# Patient Record
Sex: Female | Born: 1935 | Race: White | Hispanic: No | Smoking: Never smoker
Health system: Southern US, Community
[De-identification: ages and names within clinical notes are randomized; demographics above are authoritative.]

## PROBLEM LIST (undated history)

## (undated) DIAGNOSIS — C50919 Malignant neoplasm of unspecified site of unspecified female breast: Secondary | ICD-10-CM

## (undated) HISTORY — DX: Malignant neoplasm of unspecified site of unspecified female breast: C50.919

---

## 2012-10-28 DIAGNOSIS — L570 Actinic keratosis: Secondary | ICD-10-CM | POA: Insufficient documentation

## 2013-05-01 DIAGNOSIS — M5412 Radiculopathy, cervical region: Secondary | ICD-10-CM | POA: Insufficient documentation

## 2015-07-11 DIAGNOSIS — M792 Neuralgia and neuritis, unspecified: Secondary | ICD-10-CM | POA: Insufficient documentation

## 2017-07-24 DIAGNOSIS — G3184 Mild cognitive impairment, so stated: Secondary | ICD-10-CM | POA: Insufficient documentation

## 2017-07-24 DIAGNOSIS — K9041 Non-celiac gluten sensitivity: Secondary | ICD-10-CM | POA: Insufficient documentation

## 2017-07-24 DIAGNOSIS — M159 Polyosteoarthritis, unspecified: Secondary | ICD-10-CM | POA: Insufficient documentation

## 2018-08-13 DIAGNOSIS — M1711 Unilateral primary osteoarthritis, right knee: Secondary | ICD-10-CM | POA: Insufficient documentation

## 2019-03-31 DIAGNOSIS — R7989 Other specified abnormal findings of blood chemistry: Secondary | ICD-10-CM | POA: Insufficient documentation

## 2019-05-25 DIAGNOSIS — D049 Carcinoma in situ of skin, unspecified: Secondary | ICD-10-CM | POA: Insufficient documentation

## 2020-04-17 DIAGNOSIS — D729 Disorder of white blood cells, unspecified: Secondary | ICD-10-CM | POA: Insufficient documentation

## 2020-05-19 DIAGNOSIS — F411 Generalized anxiety disorder: Secondary | ICD-10-CM | POA: Insufficient documentation

## 2020-05-19 DIAGNOSIS — F0150 Vascular dementia without behavioral disturbance: Secondary | ICD-10-CM | POA: Insufficient documentation

## 2020-07-06 DIAGNOSIS — D472 Monoclonal gammopathy: Secondary | ICD-10-CM | POA: Insufficient documentation

## 2020-07-11 DIAGNOSIS — S32010D Wedge compression fracture of first lumbar vertebra, subsequent encounter for fracture with routine healing: Secondary | ICD-10-CM | POA: Insufficient documentation

## 2020-08-18 ENCOUNTER — Emergency Department (HOSPITAL_COMMUNITY)
Admission: EM | Admit: 2020-08-18 | Discharge: 2020-08-18 | Disposition: A | Discharge status: Home / Self Care | Payer: Medicare Other | Attending: Emergency Medicine | Admitting: Emergency Medicine

## 2020-08-18 ENCOUNTER — Emergency Department (HOSPITAL_COMMUNITY): Payer: Medicare Other

## 2020-08-18 ENCOUNTER — Encounter (HOSPITAL_COMMUNITY): Payer: Self-pay | Admitting: Emergency Medicine

## 2020-08-18 DIAGNOSIS — R030 Elevated blood-pressure reading, without diagnosis of hypertension: Secondary | ICD-10-CM

## 2020-08-18 DIAGNOSIS — M25512 Pain in left shoulder: Secondary | ICD-10-CM | POA: Diagnosis present

## 2020-08-18 DIAGNOSIS — K5909 Other constipation: Secondary | ICD-10-CM

## 2020-08-18 DIAGNOSIS — Z91010 Allergy to peanuts: Secondary | ICD-10-CM | POA: Insufficient documentation

## 2020-08-18 DIAGNOSIS — M549 Dorsalgia, unspecified: Secondary | ICD-10-CM | POA: Diagnosis not present

## 2020-08-18 NOTE — ED Notes (Signed)
Patient verbalizes understanding of discharge instructions. Opportunity for questioning and answers were provided. Armband removed by staff, pt discharged from ED via wheelchair with daughter.  

## 2020-08-18 NOTE — Discharge Instructions (Addendum)
It was our pleasure to provide your ER care today - we hope that you feel better.  Your blood pressure is high tonight - follow up with primary care doctor in the coming week for recheck.   For mild constipation, drink plenty of water, get adequate fiber in diet, and consider taking colace 2x/day (stool softener), and miralax once a day as need (laxative).   Return to ER right away if worse, new symptoms, new or severe pain, chest pain, trouble breathing, weak/fainting, fevers, or other concern.

## 2020-08-18 NOTE — ED Triage Notes (Signed)
Pt arrives via [facility] stretcher c/o back pain located near her L scapula. Reports pain started after laying down from dinner. Denies fall/LOC. Per pt has recent hx of thoracic/lumbar fx from fall. Unknown date and severity.   Last EMS VS 180/100, HR 90, 20 RR, 97% on RA, 98.6

## 2020-08-18 NOTE — ED Provider Notes (Signed)
[facility] EMERGENCY DEPARTMENT Provider Note   CSN: [id number] Arrival date & time: 08/18/20  1919     History Chief Complaint  Patient presents with   Back Pain    Madison Bowers is a 86 y.o. female.  Patient c/o pain to left scapular area acute onset at rest tonight. Symptoms acute onset, dull, spasm type pain, moderate, non radiating. No hx same pain. No radicular pain. Denies any anterior pain. No chest pain or discomfort. No sob. No cough or uri symptoms. No abd or pelvic pain, although does states she wondered whether could be constipated as no bm x 2 days. Denies gu c/o, no dysuria or hematuria. No fever or chills. No strain or injury to shoulder or back.   The history is provided by the patient.  Back Pain Associated symptoms: no abdominal pain, no chest pain, no dysuria, no fever and no headaches        History reviewed. No pertinent past medical history.  There are no problems to display for this patient.   History reviewed. No pertinent surgical history.   OB History   No obstetric history on file.     History reviewed. No pertinent family history.  Social History   Tobacco Use   Smoking status: Not on file  Substance Use Topics   Alcohol use: Not on file   Drug use: Not on file    Home Medications Prior to Admission medications   Medication Sig Start Date End Date Taking? Authorizing Provider  memantine (NAMENDA) 10 MG tablet Take 10 mg by mouth 2 (two) times daily.   Yes [provider]  venlafaxine XR (EFFEXOR-XR) 150 MG 24 hr capsule Take 300 mg by mouth daily.    Yes [provider]    Allergies    Sulfa antibiotics, Cyclobenzaprine, and Peanut-containing drug products  Review of Systems   Review of Systems  Constitutional: Negative for fever.  HENT: Negative for sore throat.   Eyes: Negative for redness.  Respiratory: Negative for shortness of breath.   Cardiovascular: Negative for chest  pain and leg swelling.  Gastrointestinal: Negative for abdominal pain, nausea and vomiting.  Genitourinary: Negative for dysuria, flank pain and hematuria.  Musculoskeletal: Positive for back pain. Negative for neck pain.  Skin: Negative for rash.  Neurological: Negative for headaches.  Hematological: Does not bruise/bleed easily.  Psychiatric/Behavioral: Negative for confusion.    Physical Exam Updated Vital Signs BP (!) 201/91 (BP Location: Right Arm)    Pulse 71    Temp 98.7 F (37.1 C) (Oral)    Resp 16    SpO2 99%   Physical Exam Vitals and nursing note reviewed.  Constitutional:      Appearance: Normal appearance. She is well-developed.  HENT:     Head: Atraumatic.     Nose: Nose normal.     Mouth/Throat:     Mouth: Mucous membranes are moist.  Eyes:     General: No scleral icterus.    Conjunctiva/sclera: Conjunctivae normal.  Neck:     Trachea: No tracheal deviation.  Cardiovascular:     Rate and Rhythm: Normal rate and regular rhythm.     Pulses: Normal pulses.     Heart sounds: Normal heart sounds. No murmur heard.  No friction rub. No gallop.   Pulmonary:     Effort: Pulmonary effort is normal. No respiratory distress.     Breath sounds: Normal breath sounds.  Abdominal:     General: Bowel sounds are  normal. There is no distension.     Palpations: Abdomen is soft.     Tenderness: There is no abdominal tenderness. There is no guarding.  Genitourinary:    Comments: No cva tenderness.  Musculoskeletal:        General: No swelling.     Cervical back: Normal range of motion and neck supple. No rigidity. No muscular tenderness.     Comments: T spine non tender, aligned. Mild tenderness medial and inferior to scapula ?muscle spasm. Good rom left shoulder without pain. Bil rad pulses and fem pulses 2+, equal.  Skin:    General: Skin is warm and dry.     Findings: No rash.  Neurological:     Mental Status: She is alert.     Comments: Alert, speech normal.  Motor/sens grossly intact bil. Steady gait.   Psychiatric:        Mood and Affect: Mood normal.     ED Results / Procedures / Treatments   Labs (all labs ordered are listed, but only abnormal results are displayed) Labs Reviewed - No data to display  EKG EKG Interpretation  Date/Time:  Thursday August 18 2020 20:53:24 EDT Ventricular Rate:  71 PR Interval:  132 QRS Duration: 88 QT Interval:  396 QTC Calculation: 430 R Axis:   -29 Text Interpretation: Normal sinus rhythm Non-specific ST-t changes No previous tracing Confirmed by Lajean Saver [phone]) on 08/18/2020 9:18:27 PM   Radiology DG ABD ACUTE 2+V W 1V CHEST  Result Date: 08/18/2020 CLINICAL DATA:  Pain located near her left scapula. EXAM: DG ABDOMEN ACUTE WITH 1 VIEW CHEST COMPARISON:  None. FINDINGS: The heart size and mediastinal contours are within normal limits. Compressive changes at the left base. No focal consolidation. No pulmonary edema. No pleural effusion. No pneumothorax. No acute osseous abnormality. Multilevel degenerative changes of the spine. Left axillary/chest wall surgical changes. Stool throughout the colon. There is no evidence of dilated bowel loops or free intraperitoneal air. No radiopaque calculi or other significant radiographic abnormality is seen. IMPRESSION: No acute cardiopulmonary disease. Negative abdominal radiographs. Electronically Signed   By: Iven Finn M.D.   On: 08/18/2020 20:56    Procedures Procedures (including critical care time)  Medications Ordered in ED Medications - No data to display  ED Course  I have reviewed the triage vital signs and the nursing notes.  Pertinent labs & imaging results that were available during my care of the patient were reviewed by me and considered in my medical decision making (see chart for details).    MDM Rules/Calculators/A&P                          Imaging ordered.   Reviewed nursing notes and prior charts for additional history.    Pt indicates earlier symptoms/pain has completely resolved.   Recheck pt, ambulatory to bathroom. No recurrent of any symptoms. Pt indicates is symptom free and feels ready for d/c to home.   Xrays reviewed/interpreted by me - some stool, no sbo.   Repeat bp 201/91, discussed w pt. No symptoms. No headache. No back pain. No chest pain.   Pt and daughter continue to request d/c to home, indicating no symptoms.   Pt currently appears stable for d/c.   Rec close pcp f/u, including recheck bp.  Return precautions provided.      Final Clinical Impression(s) / ED Diagnoses Final diagnoses:  None    Rx / DC Orders ED Discharge  Orders    None       Lajean Saver, MD 08/18/20 2140

## 2020-09-02 ENCOUNTER — Ambulatory Visit (INDEPENDENT_AMBULATORY_CARE_PROVIDER_SITE_OTHER): Payer: Medicare Other | Admitting: Otolaryngology

## 2020-12-28 ENCOUNTER — Encounter: Payer: Medicare Other | Admitting: Hematology and Oncology

## 2021-09-25 ENCOUNTER — Other Ambulatory Visit: Payer: Self-pay

## 2021-09-25 ENCOUNTER — Ambulatory Visit (INDEPENDENT_AMBULATORY_CARE_PROVIDER_SITE_OTHER): Payer: Medicare Other | Admitting: Physician Assistant

## 2021-09-25 ENCOUNTER — Encounter: Payer: Self-pay | Admitting: Physician Assistant

## 2021-09-25 VITALS — BP 150/86 | HR 82 | Ht 63.0 in | Wt 121.0 lb

## 2021-09-25 DIAGNOSIS — R251 Tremor, unspecified: Secondary | ICD-10-CM | POA: Diagnosis not present

## 2021-09-25 DIAGNOSIS — Z8673 Personal history of transient ischemic attack (TIA), and cerebral infarction without residual deficits: Secondary | ICD-10-CM

## 2021-09-25 DIAGNOSIS — F419 Anxiety disorder, unspecified: Secondary | ICD-10-CM | POA: Insufficient documentation

## 2021-09-25 DIAGNOSIS — K5090 Crohn's disease, unspecified, without complications: Secondary | ICD-10-CM | POA: Insufficient documentation

## 2021-09-25 DIAGNOSIS — R2689 Other abnormalities of gait and mobility: Secondary | ICD-10-CM

## 2021-09-25 NOTE — Progress Notes (Addendum)
Assessment/Plan:   Madison Bowers is delightful 87 y.o. year old [place] female with risk factors including  age, hypertension, hyperlipidemia, chronic cervical radiculopathy, osteoarthritis hypothyroidism,  h/o TIA 05/2020 Crohn's disease, history of IgG monoclonal gammopathy, depression, L breast Ca, and a diagnosis of vascular dementia without behavioral disturbance seen today for evaluation of memory loss and balance issues. MmSE today is 25/30. Patient currently on Memantine 10 mg bid by her PCP    Recommendations:   Mild Dementia with behavioral disturbance Tremor  MRI brain with/without contrast to assess for underlying structural abnormality and assess vascular load  Discussed safety both in and out of the home.  Referral to physical therapy for strength and balance in view of recent falls, as well as changes in her ambulation, prior history of TIA July 2021 Discussed the importance of regular daily schedule to maintain brain function.  Continue to monitor mood with PCP.  Stay active at least 30 minutes at least 3 times a week.  Naps should be scheduled and should be no longer than 60 minutes and should not occur after 2 PM.  Continue Memantine 10 mg twice daily by PCP   Subjective:    The patient is seen in neurologic consultation at the request of Madison Bowers* for the evaluation of memory.  The patient is accompanied by her daughter who supplements the history. This is a 87 y.o. year old [place]  female who has had memory issues for about 12 years, probably worse over the last year.  In July 2021, the patient had acute mental status changes felt to be secondary to a UTI, sustaining a fall, increasing mobility difficulties, requiring physical therapy.  Since that time, her memory is also affected, not remembering events.  Her daughter reports that she has more problems with executive function skills and organizational skills, for example if there is an appointment, she will  not remember, or she also has a hard time recalling when her daughter is to visit.  She continues to repeat stories and asking the same questions.  Emotionally, she also has changes, including angry episodes, making up a story, which she believes is real, and then "snapping out of it and becoming apologetic", something that her daughter describes as "Madison Bowers".  She has moments of sadness.  Her niece came to visit her and brought pictures of her deceased sons, which made her call her daughter telling her that "I am dying ".  Her daughter reports that she does have a history of "massive anxiety and depression ".  She is also very tired, at times does not remember that she was woken up by the staff.  She lives at [facility] and enjoys the facility very much, and takes advantage of all the opportunities there.  She is very social, and enjoys playing bingo, and other games.  She admits to not moving as much as she used to do 1 year ago, when she could walk 2 miles a day.  Now, she requires assistance, and her aide reports that she has fallen a couple of times this month.  Her daughter states that her steps are smaller, and as time goes by when she is standing, she will begin to take "smaller and smaller steps until she loses balance ".  "She cannot stand up on her own ".  She also noticed that she has slightly more tremors than prior, resting, bilateral, left greater than right.  That causes mild difficulty when trying to hold objects,  or writing.  She attends occupational therapy for this at the facility.  She sleeps fairly well, denies vivid dreams or sleepwalking, REM behavior.  She denies hallucinations or paranoia.  There are no hygiene concerns.  "My mother is very vein, and does not like somebody to assist her with dressing and bathing ".  She does require this, and also wears pads for urine incontinence.  She does not like to drink water due to "vanity ", so her "daughter has to monitor her closely.  She  also requires assistance to get dressed "I do not like it ".  The facility provides the medications, her daughter is in charge of the finances.  She does not cook. Appetite is good, no excessive drooling. She denies any headaches, double vision, dizziness, vertigo, focal numbness or tingling, unilateral weakness or anosmia.  No recent COVID.  No history of seizures.  Denies a history of OSA.  She no longer drinks alcohol, or smokes.  Family history remarkable for mother at 62 dying with dementia.  She is retired from [employer], originally from [place].    Note 02/17/2009 DR. Prenkumar  at Neurology [facility] in [place]  "Ms. Madison Bowers is a pleasant 87YO-year old -left-handed woman who presents for evaluation of cognitive concerns. She is accompanied by her friend and hx obtained from both. Patient herself does not think that she has any significant cognitive concerns. Her friend, however, noted some signs in the last year to 18 months. Noted initially repetitive statements. This as since improved with cerafolin and exercise. Misplaces items frequent, but is unclear how increased this is from baseline. She is quite organized and has always keep appointments on calendars,etc. Manages household items. Continues to cook- not forgetting recipes. Is able to use iphone well and computer without difficulty. She does get lost on occasion- but this is again a lifelong trait. No accidents. Has noted some word finding difficulty on occasion. But both, she had friend feel that this is age appropriate. She denies any personality changes. There is no inappropriate behavior. Denies any compulsive hoarding or tendencies. She has always had a "sweet tooth" and dietary habits have not changed. Denies visual hallucinations. Handles all bills and finances without difficulty. Completes all adls. There has been no decline in hygiene- she is always well dressed and well groomed.  Has a hx of mild depression  throughout her life. She denies any concerns at present. She is on Effexor 75mg  daily.  She underwent MRI brain which showed mild ischemic changes. She also underwent formal neuropsychiatric testing, which showed no evidence of mild cognitive impairment."    NEUROPSYCHOLOGY Dr. Glyn Ade Hoogs on 03/13/16:  Cognitive examination shows a mild improvement in global cognitive function compared to the prior examination, and she does not have a primary amnestic disturbance. Given the results of the brain imaging, she likely has a vascular cognitive impairment, subcortical type, and features of the tremor suggest essential tremor.   Brain MRI obtained on 06/04/16 FLAIR sequences showed a moderate amount of periventricular white matter hyperintensities. Cortical atrophy was noted, and gradient echo sequences showed one microhemorrhage in the left parietal-occipital region. Diffusion sequences did not show acute ischemia        Brain  MRI 04/18/2020 after AMS /UTI  . No acute intracranial abnormality.  2. Moderate for age signal changes in the brain compatible with chronic small vessel disease.  3. Partially visible cervical spine degeneration with suspected mild spinal stenosis at C4-C5.   Allergies  Allergen Reactions   Sulfa Antibiotics Hives   Cyclobenzaprine Other (See Comments)    Very dizzy    Current Outpatient Medications  Medication Instructions   busPIRone (BUSPAR) 10 MG tablet Oral   memantine (NAMENDA) 10 mg, Oral, 2 times daily   venlafaxine XR (EFFEXOR-XR) 300 mg, Oral, Daily     VITALS:   Vitals:   09/25/21 1426  BP: (!) 150/86  Pulse: 82  SpO2: 98%  Weight: 121 lb (54.9 kg)  Height: 5\' 3"  (1.6 m)   No flowsheet data found.  PHYSICAL EXAM   HEENT:  Normocephalic, atraumatic. The mucous membranes are moist. The superficial temporal arteries are without ropiness or tenderness. Cardiovascular: Regular rate and rhythm. Lungs: Clear to auscultation bilaterally. Neck:  There are no carotid bruits noted bilaterally.  NEUROLOGICAL: No flowsheet data found. MMSE - Mini Mental State Exam 09/25/2021  Orientation to time 5  Orientation to Place 3  Registration 3  Attention/ Calculation 5  Recall 1  Language- name 2 objects 2  Language- repeat 1  Language- follow 3 step command 3  Language- read & follow direction 1  Write a sentence 1  Copy design 0  Total score 25    No flowsheet data found.   Orientation:  Alert and oriented to person, place and time. No aphasia or dysarthria. Fund of knowledge is appropriate. Recent memory impaired and remote memory intact.  Attention and concentration are normal.  Able to name objects and repeat phrases. Delayed recall  1/3 Cranial nerves: There is good facial symmetry. Extraocular muscles are intact and visual fields are full to confrontational testing. Speech is fluent and clear. Soft palate rises symmetrically and there is no tongue deviation. Hearing is intact to conversational tone. Tone: Tone is good throughout.no cogwheeling noted  Sensation: Sensation is intact to light touch and pinprick throughout. Vibration is intact at the bilateral big toe.There is no extinction with double simultaneous stimulation. There is no sensory dermatomal level identified. Coordination: The patient has no difficulty with RAM's or FNF bilaterally. Normal finger to nose  Motor: Strength is 5/5 in the bilateral upper and lower extremities. There is no pronator drift. There are no fasciculations noted. Tremorsat rest, B, L>R  DTR's: Deep tendon reflexes are 2/4 at the bilateral biceps, triceps, brachioradialis, patella and achilles.  Plantar responses are downgoing bilaterally.  No Hoffmann sign.  No glabellar sign.    Gait and Station: The patient is able to ambulate with  difficulty, small steps unable to get up from the chair on her own, En block turn noted. .The patient is able to heel toe walk without any difficulty. The patient is  unable to stand in the Romberg position on her own.      Thank you for allowing [place] the opportunity to participate in the care of this nice patient. Please do not hesitate to contact us for any questions or concerns.   Total time spent on today's visit was 60 minutes, including both face-to-face time and nonface-to-face time.  Time included that spent on review of records (prior notes available to me/labs/imaging if pertinent), discussing treatment and goals, answering patient's questions and coordinating care.  Cc:  Madison Sessions, NP  Sharene Butters 09/25/2021 3:36 PM

## 2021-09-25 NOTE — Patient Instructions (Addendum)
Very nice to meet you   Continue Memantine 10 mg twice daily. Side effects were discussed  MRI of the brain to further evaluate the structure of the brain and the vascular load  PLease continue Physical Therapy/OT  for strength and BAlance  Follow up in 6 months   Please hydrate  It was a pleasure to see you today at our office.  Continue to care about the cardiovascular risk factors   FALL PRECAUTIONS: Be cautious when walking. Scan the area for obstacles that may increase the risk of trips and falls. When getting up in the mornings, sit up at the edge of the bed for a few minutes before getting out of bed. Consider elevating the bed at the head end to avoid drop of blood pressure when getting up. Walk always in a well-lit room (use night lights in the walls). Avoid area rugs or power cords from appliances in the middle of the walkways. Use a walker or a cane if necessary and consider physical therapy for balance exercise. Get your eyesight checked regularly.      RECOMMENDATIONS FOR ALL PATIENTS WITH MEMORY PROBLEMS: 1. Continue to exercise (Recommend 30 minutes of walking everyday, or 3 hours every week) 2. Increase social interactions - continue going to [place] and enjoy social gatherings with friends and family 3. Eat healthy, avoid fried foods and eat more fruits and vegetables 4. Maintain adequate blood pressure, blood sugar, and blood cholesterol level. Reducing the risk of stroke and cardiovascular disease also helps promoting better memory. 5. Avoid stressful situations. Live a simple life and avoid aggravations. Organize your time and prepare for the next day in anticipation. 6. Sleep well, avoid any interruptions of sleep and avoid any distractions in the bedroom that may interfere with adequate sleep quality 7. Avoid sugar, avoid sweets as there is a strong link between excessive sugar intake, diabetes, and cognitive impairment We discussed the Mediterranean diet, which has been  shown to help patients reduce the risk of progressive memory disorders and reduces cardiovascular risk. This includes eating fish, eat fruits and green leafy vegetables, nuts like almonds and hazelnuts, walnuts, and also use olive oil. Avoid fast foods and fried foods as much as possible. Avoid sweets and sugar as sugar use has been linked to worsening of memory function.

## 2021-09-26 ENCOUNTER — Telehealth: Payer: Self-pay | Admitting: Physician Assistant

## 2021-09-26 NOTE — Telephone Encounter (Signed)
Patient was seen yesterday by wertman, and tested positive yesterday. They had an outbreak at her facility of 20 people

## 2021-10-19 ENCOUNTER — Telehealth: Payer: Self-pay | Admitting: Physician Assistant

## 2021-10-19 ENCOUNTER — Other Ambulatory Visit: Payer: Self-pay | Admitting: Physician Assistant

## 2021-10-19 ENCOUNTER — Ambulatory Visit
Admission: RE | Admit: 2021-10-19 | Discharge: 2021-10-19 | Disposition: A | Discharge status: Home / Self Care | Payer: Medicare Other | Source: Ambulatory Visit | Attending: Physician Assistant | Admitting: Physician Assistant

## 2021-10-19 DIAGNOSIS — R2689 Other abnormalities of gait and mobility: Secondary | ICD-10-CM

## 2021-10-19 DIAGNOSIS — Z8673 Personal history of transient ischemic attack (TIA), and cerebral infarction without residual deficits: Secondary | ICD-10-CM

## 2021-10-19 MED ORDER — DIAZEPAM 5 MG PO TABS: ORAL_TABLET | ORAL | 0 refills | Status: AC

## 2021-10-19 NOTE — Telephone Encounter (Signed)
Pt has an MRI this afternoon and is wondering if she could get a prescription for some medication to help relax her? It would need to go [facility] on [address].

## 2021-10-19 NOTE — Telephone Encounter (Signed)
Pt caregiver called an informed  Clarise Cruz PA Sent prescription to pharmacy for Valium  Take 1 tab  30 mins before the MRI , may take an additional dose prior to the procedure if needed

## 2021-10-30 ENCOUNTER — Other Ambulatory Visit: Payer: Self-pay

## 2021-10-30 ENCOUNTER — Emergency Department (HOSPITAL_COMMUNITY)
Admission: EM | Admit: 2021-10-30 | Discharge: 2021-10-31 | Disposition: A | Discharge status: Home / Self Care | Payer: Medicare Other | Attending: Emergency Medicine | Admitting: Emergency Medicine

## 2021-10-30 DIAGNOSIS — R52 Pain, unspecified: Secondary | ICD-10-CM

## 2021-10-30 DIAGNOSIS — Z853 Personal history of malignant neoplasm of breast: Secondary | ICD-10-CM | POA: Insufficient documentation

## 2021-10-30 DIAGNOSIS — R1084 Generalized abdominal pain: Secondary | ICD-10-CM | POA: Diagnosis present

## 2021-10-30 DIAGNOSIS — F0390 Unspecified dementia without behavioral disturbance: Secondary | ICD-10-CM | POA: Insufficient documentation

## 2021-10-30 DIAGNOSIS — K6289 Other specified diseases of anus and rectum: Secondary | ICD-10-CM | POA: Diagnosis not present

## 2021-10-30 LAB — CBC WITH DIFFERENTIAL/PLATELET
Abs Immature Granulocytes: 0.14 10*3/uL — ABNORMAL HIGH (ref 0.00–0.07)
Basophils Absolute: 0 10*3/uL (ref 0.0–0.1)
Basophils Relative: 0 %
Eosinophils Absolute: 0 10*3/uL (ref 0.0–0.5)
Eosinophils Relative: 0 %
HCT: 35.6 % — ABNORMAL LOW (ref 36.0–46.0)
Hemoglobin: 11.8 g/dL — ABNORMAL LOW (ref 12.0–15.0)
Immature Granulocytes: 1 %
Lymphocytes Relative: 4 %
Lymphs Abs: 0.7 10*3/uL (ref 0.7–4.0)
MCH: 32 pg (ref 26.0–34.0)
MCHC: 33.1 g/dL (ref 30.0–36.0)
MCV: 96.5 fL (ref 80.0–100.0)
Monocytes Absolute: 1.4 10*3/uL — ABNORMAL HIGH (ref 0.1–1.0)
Monocytes Relative: 8 %
Neutro Abs: 14.6 10*3/uL — ABNORMAL HIGH (ref 1.7–7.7)
Neutrophils Relative %: 87 %
Platelets: 187 10*3/uL (ref 150–400)
RBC: 3.69 MIL/uL — ABNORMAL LOW (ref 3.87–5.11)
RDW: 13.1 % (ref 11.5–15.5)
WBC: 16.8 10*3/uL — ABNORMAL HIGH (ref 4.0–10.5)
nRBC: 0 % (ref 0.0–0.2)

## 2021-10-30 LAB — LIPASE, BLOOD: Lipase: 25 U/L (ref 11–51)

## 2021-10-30 LAB — COMPREHENSIVE METABOLIC PANEL
ALT: 12 U/L (ref 0–44)
AST: 16 U/L (ref 15–41)
Albumin: 3.6 g/dL (ref 3.5–5.0)
Alkaline Phosphatase: 51 U/L (ref 38–126)
Anion gap: 10 (ref 5–15)
BUN: 20 mg/dL (ref 8–23)
CO2: 23 mmol/L (ref 22–32)
Calcium: 8.9 mg/dL (ref 8.9–10.3)
Chloride: 101 mmol/L (ref 98–111)
Creatinine, Ser: 0.87 mg/dL (ref 0.44–1.00)
GFR, Estimated: >60 mL/min (ref >60)
Glucose, Bld: 117 mg/dL — ABNORMAL HIGH (ref 70–99)
Potassium: 3.9 mmol/L (ref 3.5–5.1)
Sodium: 134 mmol/L — ABNORMAL LOW (ref 135–145)
Total Bilirubin: 1.4 mg/dL — ABNORMAL HIGH (ref 0.3–1.2)
Total Protein: 6.7 g/dL (ref 6.5–8.1)

## 2021-10-30 NOTE — ED Provider Triage Note (Signed)
Emergency Medicine Provider Triage Evaluation Note Level 5 caveat: Dementia Madison Bowers , a 88 y.o. female  was evaluated in triage.  She is accompanied by her daughter.  Pt complains of generalized abdominal pain onset today. Per patient she has had diarrhea today.  Daughter notes that patient has had generalized weakness, diarrhea, decreased p.o. intake.  Patient has not been given any medications for her symptoms.  Denies any other symptoms.  Review of Systems  Level 5 caveat: Dementia  positive:  Negative:   Physical Exam  BP (!) 142/70    Pulse 79    Temp 99.7 F (37.6 C) (Oral)    Resp 16    SpO2 92%  Gen:   Awake, no distress   Resp:  Normal effort  MSK:   Moves extremities without difficulty  Other:  No abdominal tenderness to palpation.  Medical Decision Making  Medically screening exam initiated at 8:41 PM.  Appropriate orders placed.  Nalayah Hitt was informed that the remainder of the evaluation will be completed by another provider, this initial triage assessment does not replace that evaluation, and the importance of remaining in the ED until their evaluation is complete.   Emely Fahy A, PA-C 10/30/21 2051

## 2021-10-30 NOTE — ED Triage Notes (Signed)
Pt brought here by daughter from [place]. Per staff pt was acting more tired today, wasn't eating her food and had a couple episodes of diarrhea. Staff instructed daughter to take her to UC for fluids, pt "soiled" herself while at facility, and staff determined she was impacted. Daughter brought her here to be disimpacted, pt has no complaints.

## 2021-10-31 ENCOUNTER — Emergency Department (HOSPITAL_COMMUNITY): Payer: Medicare Other

## 2021-10-31 DIAGNOSIS — K6289 Other specified diseases of anus and rectum: Secondary | ICD-10-CM | POA: Diagnosis not present

## 2021-10-31 LAB — URINALYSIS, ROUTINE W REFLEX MICROSCOPIC
Bilirubin Urine: NEGATIVE
Glucose, UA: NEGATIVE mg/dL
Ketones, ur: 15 mg/dL — AB
Nitrite: NEGATIVE
Protein, ur: NEGATIVE mg/dL
Specific Gravity, Urine: 1.01 (ref 1.005–1.030)
pH: 6 (ref 5.0–8.0)

## 2021-10-31 LAB — URINALYSIS, MICROSCOPIC (REFLEX)

## 2021-10-31 MED ORDER — AMOXICILLIN-POT CLAVULANATE 875-125 MG PO TABS: 1.0000 | ORAL_TABLET | Freq: Two times a day (BID) | ORAL | 0 refills | Status: AC

## 2021-10-31 MED ORDER — IOHEXOL 300 MG/ML  SOLN
80.0000 mL | Freq: Once | INTRAMUSCULAR | Status: AC | PRN
  Administered 2021-10-31: 80 mL via INTRAVENOUS

## 2021-10-31 MED ORDER — SODIUM CHLORIDE 0.9 % IV BOLUS
1000.0000 mL | Freq: Once | INTRAVENOUS | Status: AC
  Administered 2021-10-31: 1000 mL via INTRAVENOUS

## 2021-10-31 NOTE — ED Notes (Signed)
Patient back from CT.

## 2021-10-31 NOTE — Discharge Instructions (Signed)
Drink plenty of fluids and follow-up with your family doctor the beginning of the week.  Discuss your constipation with your family doctor and they may consider starting some Metamucil

## 2021-10-31 NOTE — ED Notes (Signed)
Triage RN made aware of temperature, BP, and O2 sat.

## 2021-10-31 NOTE — ED Notes (Signed)
Patient transported to CT 

## 2021-10-31 NOTE — ED Notes (Signed)
Pt cleaned up of small BM, fresh brief placed on pt. Pt assisted in getting dressed. AVS with prescriptions provided to and discussed with patient and family member at bedside. Pt verbalizes understanding of discharge instructions and denies any questions or concerns at this time. Pt has ride home. Pt taken out of department via W/C and assisted into family member's vehicle.

## 2021-10-31 NOTE — ED Notes (Signed)
Patient is having large bowel movements at this time

## 2021-10-31 NOTE — ED Notes (Signed)
Patient assisted to the toilet by this NT and patients daughter. Patient cleaned up and brief put on patient. Barrier ointment put on patient.

## 2021-10-31 NOTE — ED Notes (Signed)
Tap water enema completed. Small amount of liquid stool out. Secure chat sent to MD.

## 2021-10-31 NOTE — ED Provider Notes (Signed)
[facility] EMERGENCY DEPARTMENT Provider Note   CSN: [id number] Arrival date & time: 10/30/21  1800     History  Chief Complaint  Patient presents with   Abdominal Pain    Madison Bowers is a 88 y.o. female.  Patient presents with abdominal pain and weakness and not eating or drinking much.  Patient has history of breast cancer and memory problems.  Patient also has weakness  The history is provided by the patient and medical records. No language interpreter was used.  Abdominal Pain Pain location:  Generalized Pain quality: aching   Pain radiates to:  Does not radiate Pain severity:  Mild Onset quality:  Sudden Timing:  Sporadic Chronicity:  New Context: not alcohol use   Relieved by:  Nothing Worsened by:  Nothing Ineffective treatments:  None tried Associated symptoms: fatigue   Associated symptoms: no chest pain, no cough, no diarrhea and no hematuria       Home Medications Prior to Admission medications   Medication Sig Start Date End Date Taking? Authorizing Provider  amoxicillin-clavulanate (AUGMENTIN) 875-125 MG tablet Take 1 tablet by mouth every 12 (twelve) hours. 10/31/21  Yes Milton Ferguson, MD  busPIRone (BUSPAR) 10 MG tablet Take by mouth. 04/19/20   [provider]  diazepam (VALIUM) 5 MG tablet Take 1 tab  30 mins before the MRI , may take an additional dose prior to the procedure if needed 10/19/21   Rondel Jumbo, PA-C  memantine (NAMENDA) 10 MG tablet Take 10 mg by mouth 2 (two) times daily.    [provider]  venlafaxine XR (EFFEXOR-XR) 150 MG 24 hr capsule Take 300 mg by mouth daily.     [provider]      Allergies    Sulfa antibiotics and Cyclobenzaprine    Review of Systems   Review of Systems  Constitutional:  Positive for fatigue. Negative for appetite change.  HENT:  Negative for congestion, ear discharge and sinus pressure.   Eyes:  Negative for discharge.  Respiratory:  Negative for  cough.   Cardiovascular:  Negative for chest pain.  Gastrointestinal:  Positive for abdominal pain. Negative for diarrhea.  Genitourinary:  Negative for frequency and hematuria.  Musculoskeletal:  Negative for back pain.  Skin:  Negative for rash.  Neurological:  Negative for seizures and headaches.  Psychiatric/Behavioral:  Negative for hallucinations.    Physical Exam Updated Vital Signs BP (!) 139/54    Pulse 74    Temp 99.8 F (37.7 C) (Oral)    Resp (!) 23    SpO2 94%  Physical Exam Vitals and nursing note reviewed.  Constitutional:      Appearance: She is well-developed.  HENT:     Head: Normocephalic.     Nose: Nose normal.  Eyes:     General: No scleral icterus.    Conjunctiva/sclera: Conjunctivae normal.  Neck:     Thyroid: No thyromegaly.  Cardiovascular:     Rate and Rhythm: Normal rate and regular rhythm.     Heart sounds: No murmur heard.   No friction rub. No gallop.  Pulmonary:     Breath sounds: No stridor. No wheezing or rales.  Chest:     Chest wall: No tenderness.  Abdominal:     General: There is no distension.     Tenderness: There is no abdominal tenderness. There is no rebound.  Musculoskeletal:        General: Normal range of motion.     Cervical  back: Neck supple.  Lymphadenopathy:     Cervical: No cervical adenopathy.  Skin:    Findings: No erythema or rash.  Neurological:     Mental Status: She is alert and oriented to person, place, and time.     Motor: No abnormal muscle tone.     Coordination: Coordination normal.  Psychiatric:        Behavior: Behavior normal.    ED Results / Procedures / Treatments   Labs (all labs ordered are listed, but only abnormal results are displayed) Labs Reviewed  CBC WITH DIFFERENTIAL/PLATELET - Abnormal; Notable for the following components:      Result Value   WBC 16.8 (*)    RBC 3.69 (*)    Hemoglobin 11.8 (*)    HCT 35.6 (*)    Neutro Abs 14.6 (*)    Monocytes Absolute 1.4 (*)    Abs Immature  Granulocytes 0.14 (*)    All other components within normal limits  COMPREHENSIVE METABOLIC PANEL - Abnormal; Notable for the following components:   Sodium 134 (*)    Glucose, Bld 117 (*)    Total Bilirubin 1.4 (*)    All other components within normal limits  LIPASE, BLOOD  URINALYSIS, ROUTINE W REFLEX MICROSCOPIC    EKG None  Radiology CT Abdomen Pelvis W Contrast  Result Date: 10/31/2021 CLINICAL DATA:  Sepsis. EXAM: CT ABDOMEN AND PELVIS WITH CONTRAST TECHNIQUE: Multidetector CT imaging of the abdomen and pelvis was performed using the standard protocol following bolus administration of intravenous contrast. RADIATION DOSE REDUCTION: This exam was performed according to the departmental dose-optimization program which includes automated exposure control, adjustment of the mA and/or kV according to patient size and/or use of iterative reconstruction technique. CONTRAST:  32mL OMNIPAQUE IOHEXOL 300 MG/ML  SOLN COMPARISON:  04/17/2020. FINDINGS: Lower chest: Scattered coronary artery calcifications are noted. Mild atelectasis or scarring is present at the lung bases. Hepatobiliary: There is mild fatty infiltration of the liver. The gallbladder is distended and hyperdense material is noted in the dependent portion. No biliary ductal dilatation. Pancreas: Unremarkable. No pancreatic ductal dilatation or surrounding inflammatory changes. Spleen: Normal in size without focal abnormality. Adrenals/Urinary Tract: The adrenal glands are within normal limits. The kidneys enhance symmetrically. There is no hydronephrosis. Evaluation for renal calculus is limited due to excreted contrast in the collecting systems bilaterally. The bladder is unremarkable. Stomach/Bowel: There is a small hiatal hernia. No bowel obstruction, free air or pneumatosis. A moderate to large amount of stool is present in the colon and rectum. There is mild rectal wall thickening with perirectal fat stranding and edema in the  presacral space. The rectum is displaced below the level of the pubococcygeal line and out of the field of view and the possibility of rectal prolapse can not be excluded. Scattered diverticula are noted along the colon without evidence of diverticulitis. The appendix is not visualized on exam. Vascular/Lymphatic: Aortic atherosclerosis. No enlarged abdominal or pelvic lymph nodes. Reproductive: Coarse calcifications are present in the uterus suggesting degenerating fibroids. No adnexal mass is seen. Other: No free fluid. A fat containing inguinal hernia is noted on the left. Musculoskeletal: A left breast prosthesis is noted. Degenerative changes are present in the thoracolumbar spine. Compression deformities are noted at T12 and L1 with progression from 2021 resulting in at least moderate spinal canal stenosis and compression of the thecal sac. No new fracture is identified. IMPRESSION: 1. Rectal wall thickening with perirectal fat stranding and surrounding edema, compatible with proctitis. A  large amount of stool is present in the colon and rectum suggesting constipation. There is inferior displacement of the rectum below the pubococcygeal line and out of the field of view and the possibility of rectal prolapse can not be excluded. 2. Distended gallbladder containing hyperdense material, likely stones. Ultrasound is suggested for further evaluation. 3. Compression deformities at T12 and L1 with progression from the prior exam resulting in at least moderate spinal canal stenosis with likely compression of the thecal sac. 4. Aortic atherosclerosis. 5. Small hiatal hernia. 6. Mild hepatic steatosis. 7. Scattered coronary artery calcifications. Electronically Signed   By: Brett Fairy M.D.   On: 10/31/2021 01:01   US Abdomen Limited RUQ (LIVER/GB)  Result Date: 10/31/2021 CLINICAL DATA:  Abdominal pain EXAM: ULTRASOUND ABDOMEN LIMITED RIGHT UPPER QUADRANT COMPARISON:  CT AP 10/31/2021 FINDINGS: Gallbladder:  Gallbladder sludge and gallstones. Gallstones measure up to 6 mm. No gallbladder wall thickening. No pericholecystic fluid or sonographic Murphy's sign. Common bile duct: Diameter: 8 mm Liver: Increased parenchymal echogenicity consistent with hepatic steatosis. No focal lesion identified. Portal vein is patent on color Doppler imaging with normal direction of blood flow towards the liver. Other: Small amount of perinephric fluid around the right kidney noted. IMPRESSION: 1. Gallstones and gallbladder sludge. No gallbladder wall thickening, pericholecystic fluid or sonographic Murphy's sign noted. 2. Mild increase caliber of the common bile duct measures 8 mm. 3. Increased liver echogenicity compatible with hepatic steatosis. Electronically Signed   By: Kerby Moors M.D.   On: 10/31/2021 09:49    Procedures Procedures    Medications Ordered in ED Medications  iohexol (OMNIPAQUE) 300 MG/ML solution 80 mL (80 mLs Intravenous Contrast Given 10/31/21 0041)  sodium chloride 0.9 % bolus 1,000 mL (0 mLs Intravenous Stopped 10/31/21 1132)    ED Course/ Medical Decision Making/ A&P                           Medical Decision Making Amount and/or Complexity of Data Reviewed Radiology: ordered.  Risk Prescription drug management.  Patient with proctitis and constipation.  I spoke with general surgery and they recommended Augmentin twice a day for along with aggressive bowel treatment.  Patient had a tap water enema and had large stool prior to that.  She will follow-up with her PCP    This patient presents to the ED for concern of weakness and abdominal pain, this involves an extensive number of treatment options, and is a complaint that carries with it a high risk of complications and morbidity.  The differential diagnosis includes cholecystitis, diverticulitis   Co morbidities that complicate the patient evaluation  Breast cancer and mild dementia   Additional history obtained:  Additional  history obtained from daughter External records from outside source obtained and reviewed including hospital record   Lab Tests:  I Ordered, and personally interpreted labs.  The pertinent results include: White blood count elevated 16,000   Imaging Studies ordered:  I ordered imaging studies including CT abdomen and ultrasound of the gallbladder I independently visualized and interpreted imaging which showed gallstones and proctitis I agree with the radiologist interpretation   Cardiac Monitoring:  The patient was maintained on a cardiac monitor.  I personally viewed and interpreted the cardiac monitored which showed an underlying rhythm of: Normal sinus rhythm   Medicines ordered and prescription drug management:  I ordered medication including normal saline bolus Reevaluation of the patient after these medicines showed that the patient improved  I have reviewed the patients home medicines and have made adjustments as needed   Test Considered:  MRI of the abdomen   Critical Interventions:  Normal saline bolus   Consultations Obtained:  I requested consultation with the general surgeon,  and discussed lab and imaging findings as well as pertinent plan - they recommend: Discharge with Augmentin   Problem List / ED Course:  Proctitis, gallstone, breast cancer   Reevaluation:  After the interventions noted above, I reevaluated the patient and found that they have :improved   Social Determinants of Health:  Mild dementia   Dispostion:  After consideration of the diagnostic results and the patients response to treatment, I feel that the patent would benefit from discharge home placed on Augmentin and will follow up with PCP.         Final Clinical Impression(s) / ED Diagnoses Final diagnoses:  Pain  Proctitis    Rx / DC Orders ED Discharge Orders          Ordered    amoxicillin-clavulanate (AUGMENTIN) 875-125 MG tablet  Every 12 hours         10/31/21 1156              Milton Ferguson, MD 11/02/21 1007

## 2021-10-31 NOTE — ED Notes (Signed)
PA in traige  made aware of trending temp. Orders to follow. Chrage RN also  made aware

## 2021-10-31 NOTE — ED Notes (Signed)
Triage rn sarah notified of temp

## 2022-03-26 ENCOUNTER — Ambulatory Visit: Payer: Medicare Other | Admitting: Physician Assistant

## 2022-07-23 IMAGING — CT CT ABD-PELV W/ CM
2 of 5 series · 15 of 46 positions shown, 17 images · IV contrast (Omni 300)
Comparison: 04/17/2020.

CLINICAL DATA: Sepsis.

EXAM:
CT ABDOMEN AND PELVIS WITH CONTRAST
TECHNIQUE: Multidetector CT imaging of the abdomen and pelvis was performed
using the standard protocol following bolus administration of
intravenous contrast.

[Series 3: a/p w/ 5mm · axial · 0.80mm/px · z∈[-109,+261]mm · 12 of 84 slices shown, 14 images]
[im 5/84  soft-tissue]
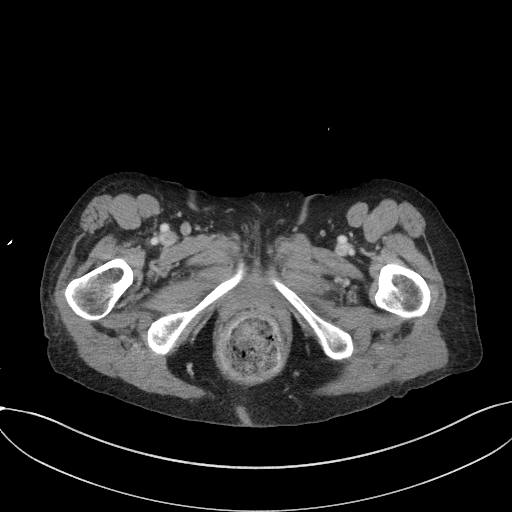
[im 5/84  bone]
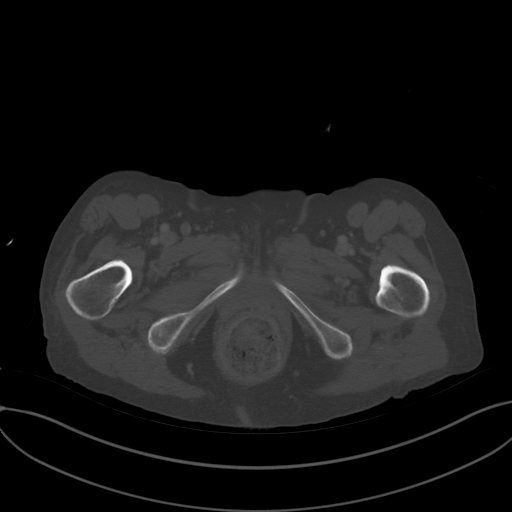
[im 14/84  soft-tissue]
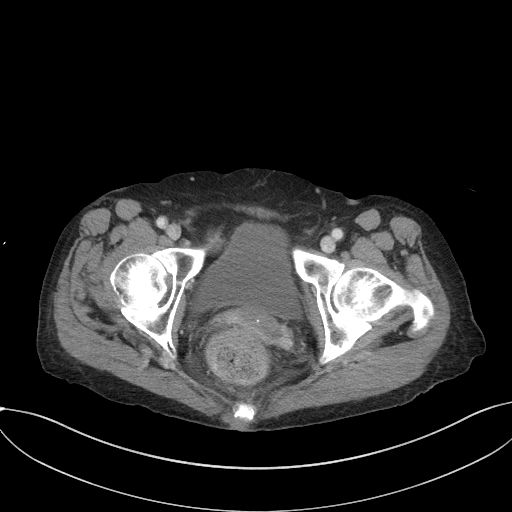
[im 19/84  soft-tissue]
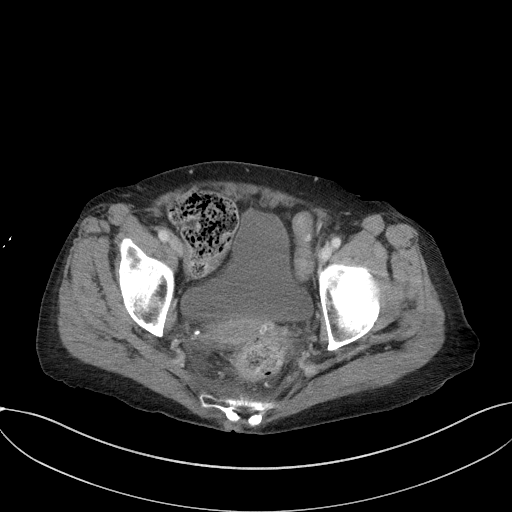
[im 24/84  soft-tissue]
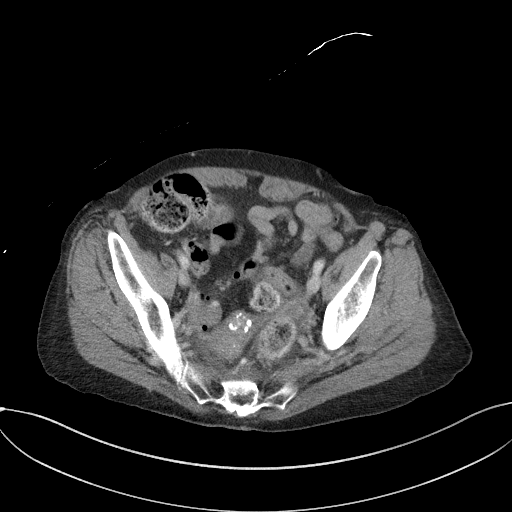
[im 33/84  soft-tissue]
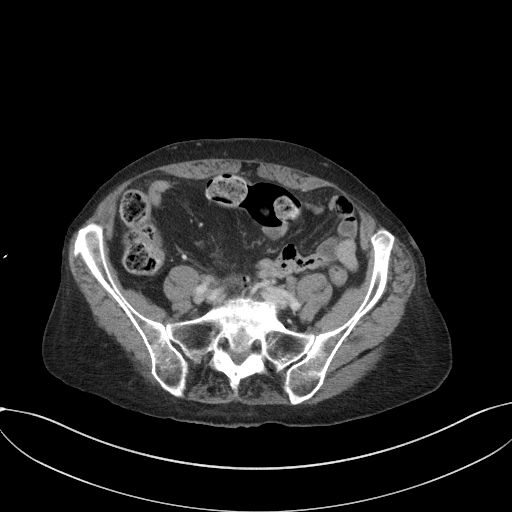
[im 37/84  soft-tissue]
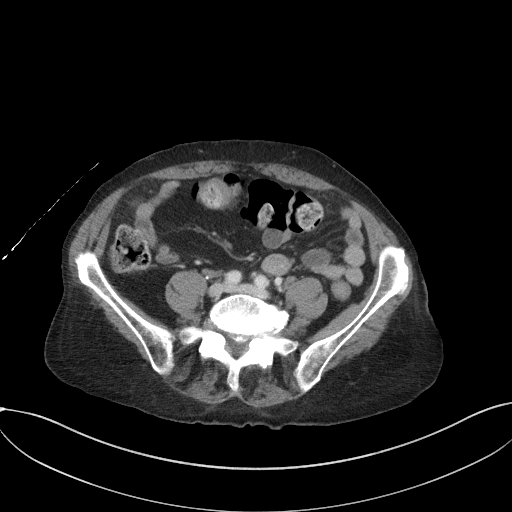
[im 47/84  soft-tissue]
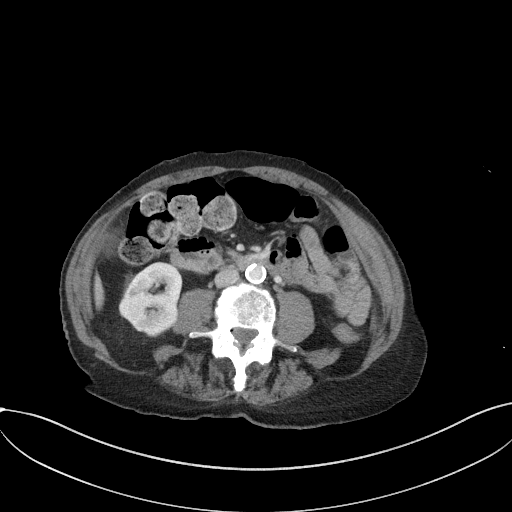
[im 51/84  soft-tissue]
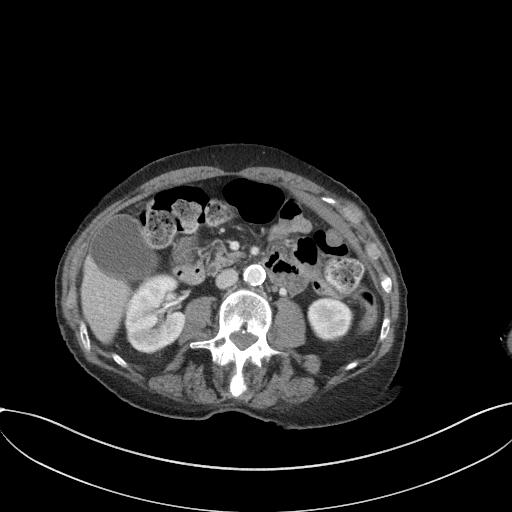
[im 60/84  soft-tissue]
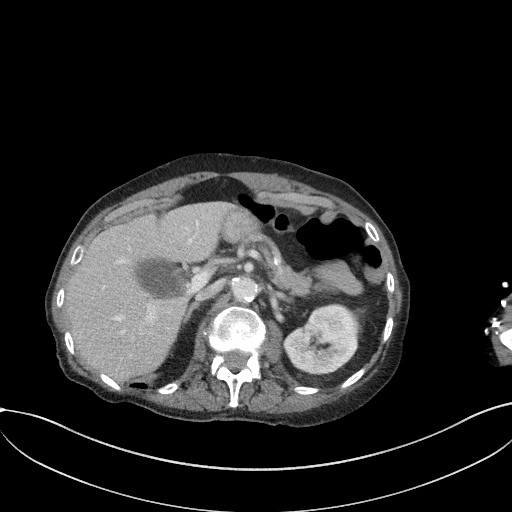
[im 60/84  bone]
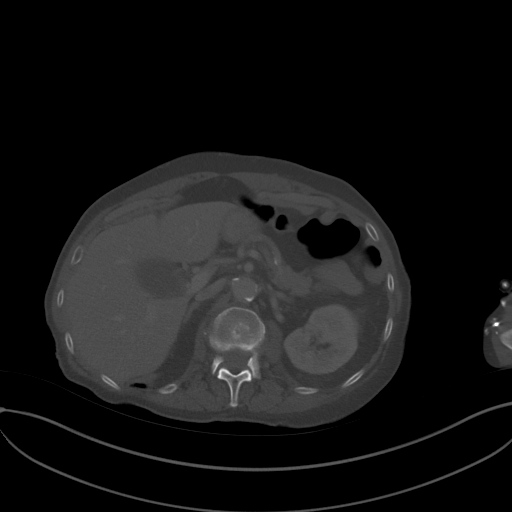
[im 65/84  soft-tissue]
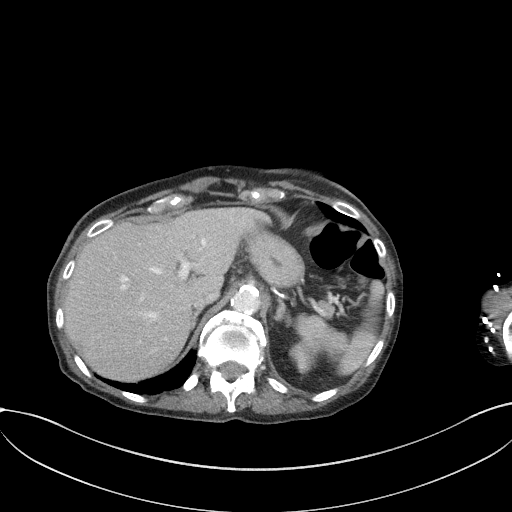
[im 70/84  soft-tissue]
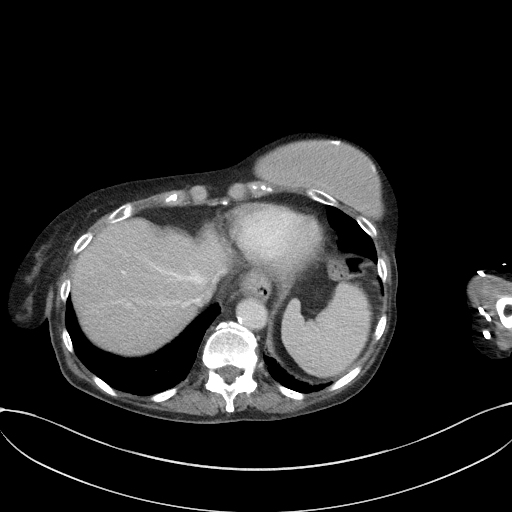
[im 79/84  soft-tissue]
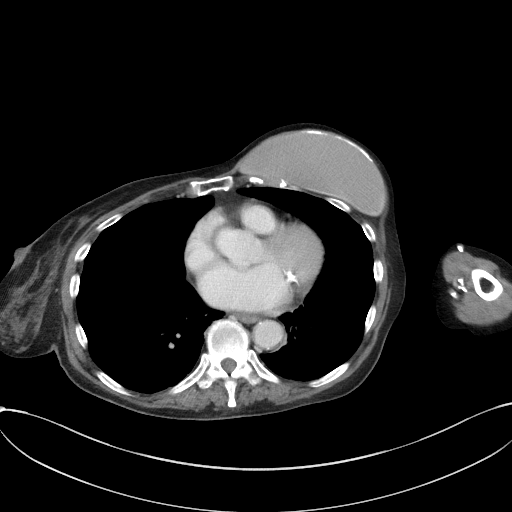

[Series 7: a/p w/ cor · coronal · 0.81mm/px · 3 of 128 slices shown]
[im 43/128  soft-tissue]
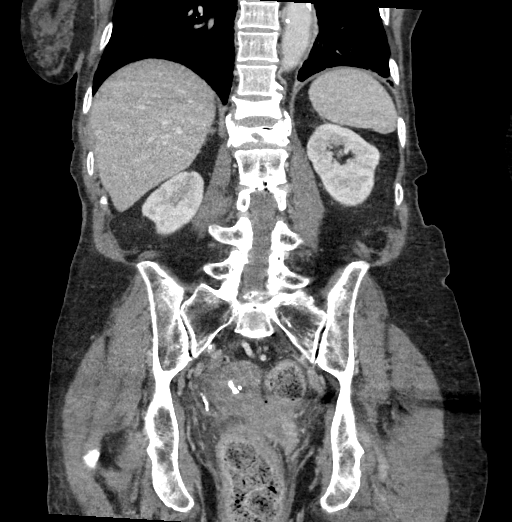
[im 57/128  soft-tissue]
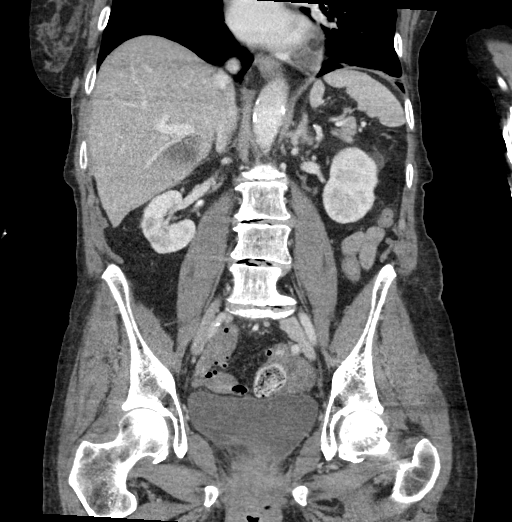
[im 71/128  soft-tissue]
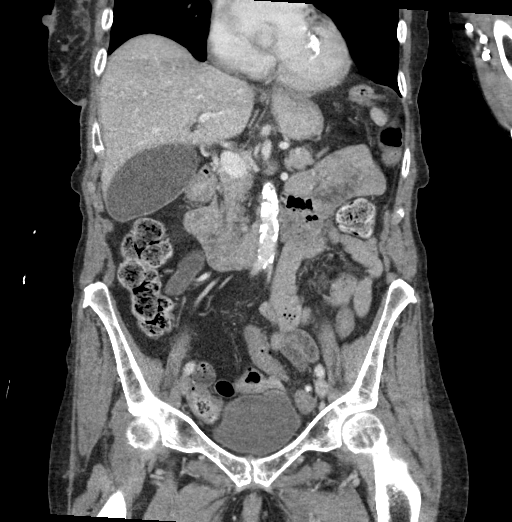

[15 of 46 positions shown; findings below may reference images not displayed]

RADIATION DOSE REDUCTION: This exam was performed according to the
departmental dose-optimization program which includes automated
exposure control, adjustment of the mA and/or kV according to
patient size and/or use of iterative reconstruction technique.

CONTRAST:  80mL OMNIPAQUE IOHEXOL 300 MG/ML  SOLN
FINDINGS: Lower chest: Scattered coronary artery calcifications are noted.
Mild atelectasis or scarring is present at the lung bases.

Hepatobiliary: There is mild fatty infiltration of the liver. The
gallbladder is distended and hyperdense material is noted in the
dependent portion. No biliary ductal dilatation.

Pancreas: Unremarkable. No pancreatic ductal dilatation or
surrounding inflammatory changes.

Spleen: Normal in size without focal abnormality.

Adrenals/Urinary Tract: The adrenal glands are within normal limits.
The kidneys enhance symmetrically. There is no hydronephrosis.
Evaluation for renal calculus is limited due to excreted contrast in
the collecting systems bilaterally. The bladder is unremarkable.

Stomach/Bowel: There is a small hiatal hernia. No bowel obstruction,
free air or pneumatosis. A moderate to large amount of stool is
present in the colon and rectum. There is mild rectal wall
thickening with perirectal fat stranding and edema in the presacral
space. The rectum is displaced below the level of the pubococcygeal
line and out of the field of view and the possibility of rectal
prolapse can not be excluded. Scattered diverticula are noted along
the colon without evidence of diverticulitis. The appendix is not
visualized on exam.

Vascular/Lymphatic: Aortic atherosclerosis. No enlarged abdominal or
pelvic lymph nodes.

Reproductive: Coarse calcifications are present in the uterus
suggesting degenerating fibroids. No adnexal mass is seen.

Other: No free fluid. A fat containing inguinal hernia is noted on
the left.

Musculoskeletal: A left breast prosthesis is noted. Degenerative
changes are present in the thoracolumbar spine. Compression
deformities are noted at T12 and L1 with progression from 5851
resulting in at least moderate spinal canal stenosis and compression
of the thecal sac. No new fracture is identified.
IMPRESSION: 1. Rectal wall thickening with perirectal fat stranding and
surrounding edema, compatible with proctitis. A large amount of
stool is present in the colon and rectum suggesting constipation.
There is inferior displacement of the rectum below the pubococcygeal
line and out of the field of view and the possibility of rectal
prolapse can not be excluded.
2. Distended gallbladder containing hyperdense material, likely
stones. Ultrasound is suggested for further evaluation.
3. Compression deformities at T12 and L1 with progression from the
prior exam resulting in at least moderate spinal canal stenosis with
likely compression of the thecal sac.
4. Aortic atherosclerosis.
5. Small hiatal hernia.
6. Mild hepatic steatosis.
7. Scattered coronary artery calcifications.

## 2022-08-26 ENCOUNTER — Emergency Department (HOSPITAL_COMMUNITY): Payer: Medicare Other

## 2022-08-26 ENCOUNTER — Emergency Department (HOSPITAL_COMMUNITY)
Admission: EM | Admit: 2022-08-26 | Discharge: 2022-08-26 | Discharge status: Against Medical Advice | Payer: Medicare Other | Attending: Physician Assistant | Admitting: Physician Assistant

## 2022-08-26 ENCOUNTER — Encounter (HOSPITAL_COMMUNITY): Payer: Self-pay

## 2022-08-26 ENCOUNTER — Other Ambulatory Visit: Payer: Self-pay

## 2022-08-26 DIAGNOSIS — R410 Disorientation, unspecified: Secondary | ICD-10-CM | POA: Insufficient documentation

## 2022-08-26 DIAGNOSIS — Z5321 Procedure and treatment not carried out due to patient leaving prior to being seen by health care provider: Secondary | ICD-10-CM | POA: Insufficient documentation

## 2022-08-26 DIAGNOSIS — W19XXXA Unspecified fall, initial encounter: Secondary | ICD-10-CM | POA: Diagnosis not present

## 2022-08-26 DIAGNOSIS — N390 Urinary tract infection, site not specified: Secondary | ICD-10-CM | POA: Insufficient documentation

## 2022-08-26 LAB — COMPREHENSIVE METABOLIC PANEL
ALT: 10 U/L (ref 0–44)
AST: 20 U/L (ref 15–41)
Albumin: 3.4 g/dL — ABNORMAL LOW (ref 3.5–5.0)
Alkaline Phosphatase: 52 U/L (ref 38–126)
Anion gap: 8 (ref 5–15)
BUN: 22 mg/dL (ref 8–23)
CO2: 27 mmol/L (ref 22–32)
Calcium: 9.5 mg/dL (ref 8.9–10.3)
Chloride: 104 mmol/L (ref 98–111)
Creatinine, Ser: 0.93 mg/dL (ref 0.44–1.00)
GFR, Estimated: 58 mL/min — ABNORMAL LOW (ref >60)
Glucose, Bld: 155 mg/dL — ABNORMAL HIGH (ref 70–99)
Potassium: 4.2 mmol/L (ref 3.5–5.1)
Sodium: 139 mmol/L (ref 135–145)
Total Bilirubin: 0.3 mg/dL (ref 0.3–1.2)
Total Protein: 6.6 g/dL (ref 6.5–8.1)

## 2022-08-26 LAB — CBC WITH DIFFERENTIAL/PLATELET
Abs Immature Granulocytes: 0.03 10*3/uL (ref 0.00–0.07)
Basophils Absolute: 0.1 10*3/uL (ref 0.0–0.1)
Basophils Relative: 1 %
Eosinophils Absolute: 0.2 10*3/uL (ref 0.0–0.5)
Eosinophils Relative: 3 %
HCT: 36.9 % (ref 36.0–46.0)
Hemoglobin: 11.6 g/dL — ABNORMAL LOW (ref 12.0–15.0)
Immature Granulocytes: 1 %
Lymphocytes Relative: 14 %
Lymphs Abs: 0.9 10*3/uL (ref 0.7–4.0)
MCH: 31.1 pg (ref 26.0–34.0)
MCHC: 31.4 g/dL (ref 30.0–36.0)
MCV: 98.9 fL (ref 80.0–100.0)
Monocytes Absolute: 0.7 10*3/uL (ref 0.1–1.0)
Monocytes Relative: 11 %
Neutro Abs: 4.6 10*3/uL (ref 1.7–7.7)
Neutrophils Relative %: 70 %
Platelets: 213 10*3/uL (ref 150–400)
RBC: 3.73 MIL/uL — ABNORMAL LOW (ref 3.87–5.11)
RDW: 12.6 % (ref 11.5–15.5)
WBC: 6.6 10*3/uL (ref 4.0–10.5)
nRBC: 0 % (ref 0.0–0.2)

## 2022-08-26 LAB — LIPASE, BLOOD: Lipase: 36 U/L (ref 11–51)

## 2022-08-26 NOTE — ED Triage Notes (Signed)
Pt arrives to ED POV with caregiver. Per Caregiver upon morning rounds pt was found sitting on the floor of her room. Fall was not witnessed. Pt A/O x3. Caregiver states pt has been more confused. No blood thinners, No trauma noted.

## 2022-08-26 NOTE — ED Notes (Signed)
Patient left on own accord °

## 2022-08-26 NOTE — ED Provider Triage Note (Signed)
Emergency Medicine Provider Triage Evaluation Note  Madison Bowers , a 88 y.o. female  was evaluated in triage.  Pt complains of confusion.  Was seen by urgent care diagnosed with UTI.  Has had multiple falls over the last 24 hours.  Patient's caregiver states she is more confused than normal.  Patient denies any complaints.  No LOC, anticoagulation.  Review of Systems  Positive: Fall, confusion, UTI Negative:   Physical Exam  BP 129/61 (BP Location: Right Arm)   Pulse 70   Temp 98.6 F (37 C) (Oral)   Resp 16   Ht '5\' 4"'$  (1.626 m)   Wt 54.4 kg   SpO2 96%   BMI 20.60 kg/m  Gen:   Awake, no distress   Resp:  Normal effort  MSK:   Moves extremities without difficulty  Other:    Medical Decision Making  Medically screening exam initiated at 4:06 PM.  Appropriate orders placed.  Madison Bowers was informed that the remainder of the evaluation will be completed by another provider, this initial triage assessment does not replace that evaluation, and the importance of remaining in the ED until their evaluation is complete.  Fall, confusion, UTI   Schuyler Behan A, PA-C 08/26/22 1608

## 2023-02-07 ENCOUNTER — Emergency Department (HOSPITAL_COMMUNITY): Payer: Medicare Other

## 2023-02-07 ENCOUNTER — Other Ambulatory Visit: Payer: Self-pay

## 2023-02-07 ENCOUNTER — Emergency Department (HOSPITAL_COMMUNITY)
Admission: EM | Admit: 2023-02-07 | Discharge: 2023-02-08 | Disposition: A | Discharge status: Home / Self Care | Payer: Medicare Other | Attending: Emergency Medicine | Admitting: Emergency Medicine

## 2023-02-07 DIAGNOSIS — M549 Dorsalgia, unspecified: Secondary | ICD-10-CM | POA: Diagnosis present

## 2023-02-07 DIAGNOSIS — M546 Pain in thoracic spine: Secondary | ICD-10-CM | POA: Insufficient documentation

## 2023-02-07 DIAGNOSIS — R519 Headache, unspecified: Secondary | ICD-10-CM | POA: Diagnosis not present

## 2023-02-07 DIAGNOSIS — W010XXA Fall on same level from slipping, tripping and stumbling without subsequent striking against object, initial encounter: Secondary | ICD-10-CM | POA: Diagnosis not present

## 2023-02-07 DIAGNOSIS — W19XXXA Unspecified fall, initial encounter: Secondary | ICD-10-CM

## 2023-02-07 MED ORDER — ACETAMINOPHEN 500 MG PO TABS
1000.0000 mg | ORAL_TABLET | Freq: Once | ORAL | Status: AC
  Administered 2023-02-07: 1000 mg via ORAL
  Filled 2023-02-07: qty 2

## 2023-02-07 MED ORDER — LIDOCAINE 5 % EX PTCH
1.0000 | MEDICATED_PATCH | Freq: Once | CUTANEOUS | Status: DC
  Administered 2023-02-07: 1 via TRANSDERMAL
  Filled 2023-02-07: qty 1

## 2023-02-07 MED ORDER — LIDOCAINE 5 % EX PTCH: 1.0000 | MEDICATED_PATCH | CUTANEOUS | 0 refills | Status: AC

## 2023-02-07 NOTE — Discharge Instructions (Addendum)
You were seen in the emergency department after your fall.  Your workup showed no signs of broken bones or bleeding in your brain.  You likely bruised the right side of your back and you can continue to take Tylenol as needed up to every 6 hours for pain.  You can also use the lidocaine patches, ice or heat.  You should try to stretch and get back to her normal activities as soon as you can to prevent your muscles from getting more stiff.  You can follow-up with your primary doctor in the next few days to have your symptoms rechecked.  You should return to the emergency department for significantly worsening pain, numbness or weakness in your arms or legs or any other new or concerning symptoms.

## 2023-02-07 NOTE — ED Triage Notes (Signed)
Pt BIBGEMS from home after having a fall. Pt had a mechanical fall after letting go of walker and fell hitting head on door frame and landing on bottom. Alert and oriented x 4. Posterior left occiput "egg" denies blood thinners,   150/ 74 hr 95% RA

## 2023-02-07 NOTE — ED Provider Notes (Signed)
[place] EMERGENCY DEPARTMENT AT [facility] Provider Note   CSN: [id number] Arrival date & time: 02/07/23  1814     History  Chief Complaint  Patient presents with   Madison Bowers    Madison Bowers is a 89 y.o. female.  Patient is a 89 year old female with a past medical history of Crohn's presenting to the emergency department after a fall.  The patient states that she was walking to her bathroom when she somehow slipped and fell landing on the right side of her back.  She is unsure if she hit her head but denies loss of consciousness.  She denies any nausea or vomiting, numbness or weakness.  She states that she is mostly having pain on her right side of her back.  She denies any lightheadedness or dizziness, chest pain or shortness of breath prior to the fall.   Fall       Home Medications Prior to Admission medications   Medication Sig Start Date End Date Taking? Authorizing Provider  lidocaine (LIDODERM) 5 % Place 1 patch onto the skin daily. Remove & Discard patch within 12 hours or as directed by MD 02/07/23  Yes Elayne Snare K, DO  amoxicillin-clavulanate (AUGMENTIN) 875-125 MG tablet Take 1 tablet by mouth every 12 (twelve) hours. 10/31/21   Bethann Berkshire, MD  busPIRone (BUSPAR) 10 MG tablet Take by mouth. 04/19/20   [provider]  diazepam (VALIUM) 5 MG tablet Take 1 tab  30 mins before the MRI , may take an additional dose prior to the procedure if needed 10/19/21   Marcos Eke, PA-C  memantine (NAMENDA) 10 MG tablet Take 10 mg by mouth 2 (two) times daily.    [provider]  venlafaxine XR (EFFEXOR-XR) 150 MG 24 hr capsule Take 300 mg by mouth daily.     [provider]      Allergies    Sulfa antibiotics and Cyclobenzaprine    Review of Systems   Review of Systems  Physical Exam Updated Vital Signs BP (!) 146/63   Pulse 65   Temp 98.6 F (37 C) (Oral)   Resp 14   Ht  (1.6 m)   Wt 56.7 kg   SpO2 94%   BMI  22.14 kg/m  Physical Exam Vitals and nursing note reviewed.  Constitutional:      General: She is not in acute distress.    Appearance: Normal appearance.  HENT:     Head: Normocephalic and atraumatic.     Nose: Nose normal.     Mouth/Throat:     Mouth: Mucous membranes are moist.     Pharynx: Oropharynx is clear.  Eyes:     Extraocular Movements: Extraocular movements intact.     Conjunctiva/sclera: Conjunctivae normal.  Neck:     Comments: No midline neck tenderness Cardiovascular:     Rate and Rhythm: Normal rate and regular rhythm.     Heart sounds: Normal heart sounds.  Pulmonary:     Effort: Pulmonary effort is normal.     Breath sounds: Normal breath sounds.  Abdominal:     General: Abdomen is flat.     Palpations: Abdomen is soft.     Tenderness: There is no abdominal tenderness.  Musculoskeletal:        General: Normal range of motion.     Cervical back: Normal range of motion and neck supple.     Comments: No midline back tenderness Right lateral thoracic muscle tenderness to palpation No  bony tenderness to bilateral upper or lower extremities  Skin:    General: Skin is warm and dry.  Neurological:     General: No focal deficit present.     Mental Status: She is alert and oriented to person, place, and time.     Sensory: No sensory deficit.     Motor: No weakness.  Psychiatric:        Mood and Affect: Mood normal.        Behavior: Behavior normal.     ED Results / Procedures / Treatments   Labs (all labs ordered are listed, but only abnormal results are displayed) Labs Reviewed - No data to display  EKG EKG Interpretation  Date/Time:  Thursday February 07 2023 18:25:44 EDT Ventricular Rate:  72 PR Interval:  131 QRS Duration: 99 QT Interval:  391 QTC Calculation: 428 R Axis:   -41 Text Interpretation: Sinus rhythm Abnormal R-wave progression, early transition Inferior infarct, old Probable anterolateral infarct, old No significant change since last  tracing Confirmed by Elayne Snare ([id number]) on 02/07/2023 6:41:33 PM  Radiology DG Ribs Unilateral W/Chest Right  Result Date: 02/07/2023 CLINICAL DATA:  Larey Seat, rib pain EXAM: RIGHT RIBS AND CHEST - 3+ VIEW COMPARISON:  08/26/2022 FINDINGS: Frontal view of the chest as well as frontal and oblique views of the right thoracic cage are obtained. Cardiac silhouette is unremarkable. No airspace disease, effusion, or pneumothorax. There are no acute displaced fractures. Surgical clips left axilla. IMPRESSION: 1. No acute displaced rib fracture. 2. No acute intrathoracic process. Electronically Signed   By: Sharlet Salina M.D.   On: 02/07/2023 21:28   CT Cervical Spine Wo Contrast  Result Date: 02/07/2023 CLINICAL DATA:  Status post trauma. EXAM: CT CERVICAL SPINE WITHOUT CONTRAST TECHNIQUE: Multidetector CT imaging of the cervical spine was performed without intravenous contrast. Multiplanar CT image reconstructions were also generated. RADIATION DOSE REDUCTION: This exam was performed according to the departmental dose-optimization program which includes automated exposure control, adjustment of the mA and/or kV according to patient size and/or use of iterative reconstruction technique. COMPARISON:  None Available. FINDINGS: Alignment: Normal. Skull base and vertebrae: No acute fracture. No primary bone lesion or focal pathologic process. Soft tissues and spinal canal: No prevertebral fluid or swelling. No visible canal hematoma. Disc levels: Marked severity endplate sclerosis, anterior osteophyte formation and posterior bony spurring are seen at the levels of C4-C5, C5-C6 and C6-C7. There is marked severity narrowing of the anterior atlantoaxial articulation. Marked severity intervertebral disc space narrowing is seen at C4-C5, C5-C6 and C6-C7. Bilateral marked severity multilevel facet joint hypertrophy is noted. Upper chest: Negative. Other: None. IMPRESSION: 1. No acute fracture or subluxation in the  cervical spine. 2. Marked severity multilevel degenerative changes. Electronically Signed   By: Aram Candela M.D.   On: 02/07/2023 19:43   CT Head Wo Contrast  Result Date: 02/07/2023 CLINICAL DATA:  Status post fall. EXAM: CT HEAD WITHOUT CONTRAST TECHNIQUE: Contiguous axial images were obtained from the base of the skull through the vertex without intravenous contrast. RADIATION DOSE REDUCTION: This exam was performed according to the departmental dose-optimization program which includes automated exposure control, adjustment of the mA and/or kV according to patient size and/or use of iterative reconstruction technique. COMPARISON:  August 26, 2022 FINDINGS: Brain: There is moderate severity cerebral atrophy with widening of the extra-axial spaces and ventricular dilatation. There are areas of decreased attenuation within the white matter tracts of the supratentorial brain, consistent with microvascular disease changes. Vascular: No  hyperdense vessel or unexpected calcification. Skull: Normal. Negative for fracture or focal lesion. Sinuses/Orbits: There is a small right maxillary sinus air-fluid level. A left maxillary sinus and anterior right ethmoid sinus polyp versus mucous retention cyst is also seen. Other: None. IMPRESSION: 1. No acute intracranial abnormality. 2. Cerebral atrophy and microvascular disease changes of the supratentorial brain. 3. Mild bilateral maxillary sinus and right ethmoid sinus disease. Electronically Signed   By: Aram Candela M.D.   On: 02/07/2023 19:40    Procedures Procedures    Medications Ordered in ED Medications  lidocaine (LIDODERM) 5 % 1-3 patch (1 patch Transdermal Patch Applied 02/07/23 2009)  acetaminophen (TYLENOL) tablet 1,000 mg (1,000 mg Oral Given 02/07/23 2009)    ED Course/ Medical Decision Making/ A&P Clinical Course as of 02/07/23 2226  Thu Feb 07, 2023  2132 No traumatic injury on imaging. Patient is stable for discharge with primary  care follow up. [VK]    Clinical Course User Index [VK] Rexford Maus, DO                             Medical Decision Making This patient presents to the ED with chief complaint(s) of fall with pertinent past medical history of Crohn's which further complicates the presenting complaint. The complaint involves an extensive differential diagnosis and also carries with it a high risk of complications and morbidity.    The differential diagnosis includes due to patient's age and her fall, and concern for ICH, mass effect, cervical spine fracture, also considering rib fracture with her right-sided thoracic tenderness, no other traumatic injuries seen on exam, no presyncopal symptoms making syncopal fall unlikely  Additional history obtained: Additional history obtained from N/A Records reviewed N/A  ED Course and Reassessment: On patient's arrival she is well-appearing in no acute distress.  The patient will have CT head, C-spine and chest x-ray with right-sided rib series.  She was given Tylenol and lidocaine patch for pain and will be closely reassessed.  Independent labs interpretation:  N/A  Independent visualization of imaging: - I independently visualized the following imaging with scope of interpretation limited to determining acute life threatening conditions related to emergency care: CTH/C-spine, R rib XR, which revealed no acute traumatic injury  Consultation: - Consulted or discussed management/test interpretation w/ external professional: N/A  Consideration for admission or further workup: Patient has no emergent conditions requiring admission or further work-up at this time and is stable for discharge home with primary care follow-up  Social Determinants of health: N/A    Amount and/or Complexity of Data Reviewed Radiology: ordered.  Risk OTC drugs. Prescription drug management.          Final Clinical Impression(s) / ED Diagnoses Final diagnoses:   Fall, initial encounter  Acute right-sided thoracic back pain    Rx / DC Orders ED Discharge Orders          Ordered    lidocaine (LIDODERM) 5 %  Every 24 hours        02/07/23 2221              Rexford Maus, DO 02/07/23 2226

## 2023-02-07 NOTE — ED Notes (Signed)
Patient sitting and watching TV waiting for PTAR. Got up to the bathroom. No distress.

## 2024-06-04 NOTE — Progress Notes (Signed)
 Called patient and notified of normal lab results.

## 2024-10-19 ENCOUNTER — Encounter (HOSPITAL_COMMUNITY): Payer: Self-pay

## 2024-10-19 ENCOUNTER — Emergency Department (HOSPITAL_COMMUNITY)
Admission: EM | Admit: 2024-10-19 | Discharge: 2024-10-19 | Disposition: A | Discharge status: Home / Self Care | Attending: Emergency Medicine | Admitting: Emergency Medicine

## 2024-10-19 ENCOUNTER — Emergency Department (HOSPITAL_COMMUNITY)

## 2024-10-19 ENCOUNTER — Other Ambulatory Visit: Payer: Self-pay

## 2024-10-19 DIAGNOSIS — D72829 Elevated white blood cell count, unspecified: Secondary | ICD-10-CM | POA: Insufficient documentation

## 2024-10-19 DIAGNOSIS — R55 Syncope and collapse: Secondary | ICD-10-CM | POA: Insufficient documentation

## 2024-10-19 DIAGNOSIS — F0390 Unspecified dementia without behavioral disturbance: Secondary | ICD-10-CM | POA: Insufficient documentation

## 2024-10-19 DIAGNOSIS — Z853 Personal history of malignant neoplasm of breast: Secondary | ICD-10-CM | POA: Insufficient documentation

## 2024-10-19 DIAGNOSIS — M25571 Pain in right ankle and joints of right foot: Secondary | ICD-10-CM | POA: Insufficient documentation

## 2024-10-19 LAB — URINALYSIS, ROUTINE W REFLEX MICROSCOPIC
Bilirubin Urine: NEGATIVE
Glucose, UA: NEGATIVE mg/dL
Hgb urine dipstick: NEGATIVE
Ketones, ur: NEGATIVE mg/dL
Leukocytes,Ua: NEGATIVE
Nitrite: NEGATIVE
Protein, ur: NEGATIVE mg/dL
Specific Gravity, Urine: 1.021 (ref 1.005–1.030)
pH: 5 (ref 5.0–8.0)

## 2024-10-19 LAB — COMPREHENSIVE METABOLIC PANEL WITH GFR
ALT: 13 U/L (ref 0–44)
AST: 24 U/L (ref 15–41)
Albumin: 3.9 g/dL (ref 3.5–5.0)
Alkaline Phosphatase: 74 U/L (ref 38–126)
Anion gap: 14 (ref 5–15)
BUN: 20 mg/dL (ref 8–23)
CO2: 23 mmol/L (ref 22–32)
Calcium: 9.4 mg/dL (ref 8.9–10.3)
Chloride: 109 mmol/L (ref 98–111)
Creatinine, Ser: 1.23 mg/dL — ABNORMAL HIGH (ref 0.44–1.00)
GFR, Estimated: 41 mL/min — ABNORMAL LOW
Glucose, Bld: 106 mg/dL — ABNORMAL HIGH (ref 70–99)
Potassium: 3.9 mmol/L (ref 3.5–5.1)
Sodium: 145 mmol/L (ref 135–145)
Total Bilirubin: 0.3 mg/dL (ref 0.0–1.2)
Total Protein: 7.1 g/dL (ref 6.5–8.1)

## 2024-10-19 LAB — CBC WITH DIFFERENTIAL/PLATELET
Abs Immature Granulocytes: 0.07 K/uL (ref 0.00–0.07)
Basophils Absolute: 0.1 K/uL (ref 0.0–0.1)
Basophils Relative: 0 %
Eosinophils Absolute: 0.1 K/uL (ref 0.0–0.5)
Eosinophils Relative: 1 %
HCT: 39.9 % (ref 36.0–46.0)
Hemoglobin: 13 g/dL (ref 12.0–15.0)
Immature Granulocytes: 1 %
Lymphocytes Relative: 7 %
Lymphs Abs: 0.9 K/uL (ref 0.7–4.0)
MCH: 31 pg (ref 26.0–34.0)
MCHC: 32.6 g/dL (ref 30.0–36.0)
MCV: 95 fL (ref 80.0–100.0)
Monocytes Absolute: 1.2 K/uL — ABNORMAL HIGH (ref 0.1–1.0)
Monocytes Relative: 10 %
Neutro Abs: 10 K/uL — ABNORMAL HIGH (ref 1.7–7.7)
Neutrophils Relative %: 81 %
Platelets: 210 K/uL (ref 150–400)
RBC: 4.2 MIL/uL (ref 3.87–5.11)
RDW: 13.7 % (ref 11.5–15.5)
WBC: 12.3 K/uL — ABNORMAL HIGH (ref 4.0–10.5)
nRBC: 0 % (ref 0.0–0.2)

## 2024-10-19 LAB — CBG MONITORING, ED: Glucose-Capillary: 121 mg/dL — ABNORMAL HIGH (ref 70–99)

## 2024-10-19 LAB — TROPONIN T, HIGH SENSITIVITY
Troponin T High Sensitivity: 25 ng/L — ABNORMAL HIGH (ref 0–19)
Troponin T High Sensitivity: 23 ng/L — ABNORMAL HIGH (ref 0–19)

## 2024-10-19 NOTE — Discharge Instructions (Signed)
 Your test results today were reassuring.  Continue home medications as prescribed.  Ensure that you are eating and drinking frequently to maintain good hydration.  Return to the emergency department for any new or worsening symptoms of concern.

## 2024-10-19 NOTE — ED Triage Notes (Signed)
 Pt coming from [place] via EMS, syncopal episode in bathroom.  Witnessed.  Pt did not fall, did not hit head.  Alert to person only at baseline with dementia 102/60 original 134/74 Hr 88 98 on RA Cbg 239 20 G RFA  500 cc NS en route EKG.  Daughter Inocente on her way to Hospital.

## 2024-10-19 NOTE — ED Provider Notes (Signed)
 " [place] EMERGENCY DEPARTMENT AT [facility] Provider Note   CSN: [id number] Arrival date & time: 10/19/24  8147     Patient presents with: Loss of Consciousness (See triage note)   Madison Bowers is a 89 y.o. female.    Loss of Consciousness Patient presents for syncope.  Medical history includes breast cancer, arthritis, dementia, Crohn's disease, anxiety.  She lives at [facility] nursing facility.  Prior to arrival, she was using the toilet with a staff member present.  Staff member witnessed a syncopal episode.  During this episode, patient remained sitting on the toilet.  She did not have a fall.  Staff member reports a 5-minute duration.  EMS noted soft blood pressures initially which improved during transit.  Patient is reportedly oriented only to self at baseline.  She is not able to provide any history at this time.  She simply repeats I do not know what is going on.     Prior to Admission medications  Medication Sig Start Date End Date Taking? Authorizing Provider  amoxicillin -clavulanate (AUGMENTIN ) 875-125 MG tablet Take 1 tablet by mouth every 12 (twelve) hours. 10/31/21   Suzette Pac, MD  busPIRone (BUSPAR) 10 MG tablet Take by mouth. 04/19/20   [provider]  diazepam  (VALIUM ) 5 MG tablet Take 1 tab  30 mins before the MRI , may take an additional dose prior to the procedure if needed 10/19/21   Wertman, Sara E, PA-C  lidocaine  (LIDODERM ) 5 % Place 1 patch onto the skin daily. Remove & Discard patch within 12 hours or as directed by MD 02/07/23   Kingsley, Victoria K, DO  memantine (NAMENDA) 10 MG tablet Take 10 mg by mouth 2 (two) times daily.    [provider]  venlafaxine XR (EFFEXOR-XR) 150 MG 24 hr capsule Take 300 mg by mouth daily.     [provider]    Allergies: Sulfa antibiotics and Cyclobenzaprine    Review of Systems  Unable to perform ROS: Dementia  Cardiovascular:  Positive for syncope.    Updated Vital  Signs BP (!) 140/73   Pulse 83   Temp 98 F (36.7 C)   Resp (!) 24   Ht 5' 3 (1.6 m)   Wt 56.7 kg   SpO2 96%   BMI 22.14 kg/m   Physical Exam Vitals and nursing note reviewed.  Constitutional:      General: She is not in acute distress.    Appearance: Normal appearance. She is well-developed. She is not ill-appearing, toxic-appearing or diaphoretic.  HENT:     Head: Normocephalic and atraumatic.     Right Ear: External ear normal.     Left Ear: External ear normal.     Nose: Nose normal.     Mouth/Throat:     Mouth: Mucous membranes are moist.  Eyes:     Extraocular Movements: Extraocular movements intact.     Conjunctiva/sclera: Conjunctivae normal.  Cardiovascular:     Rate and Rhythm: Normal rate and regular rhythm.  Pulmonary:     Effort: Pulmonary effort is normal. No respiratory distress.     Breath sounds: Normal breath sounds.  Chest:     Chest wall: No tenderness.  Abdominal:     General: There is no distension.     Palpations: Abdomen is soft.     Tenderness: There is no abdominal tenderness.  Musculoskeletal:        General: Tenderness (Right ankle) present. No swelling or deformity.  Cervical back: Normal range of motion and neck supple.  Skin:    General: Skin is warm and dry.     Coloration: Skin is not jaundiced or pale.  Neurological:     General: No focal deficit present.     Mental Status: She is alert. Mental status is at baseline. She is disoriented.     Cranial Nerves: No cranial nerve deficit.     Sensory: No sensory deficit.     Motor: No weakness.     Coordination: Coordination normal.  Psychiatric:        Mood and Affect: Mood normal.        Behavior: Behavior normal.     (all labs ordered are listed, but only abnormal results are displayed) Labs Reviewed  CBC WITH DIFFERENTIAL/PLATELET - Abnormal; Notable for the following components:      Result Value   WBC 12.3 (*)    Neutro Abs 10.0 (*)    Monocytes Absolute 1.2 (*)     All other components within normal limits  COMPREHENSIVE METABOLIC PANEL WITH GFR - Abnormal; Notable for the following components:   Glucose, Bld 106 (*)    Creatinine, Ser 1.23 (*)    GFR, Estimated 41 (*)    All other components within normal limits  URINALYSIS, ROUTINE W REFLEX MICROSCOPIC - Abnormal; Notable for the following components:   Color, Urine AMBER (*)    APPearance CLOUDY (*)    All other components within normal limits  CBG MONITORING, ED - Abnormal; Notable for the following components:   Glucose-Capillary 121 (*)    All other components within normal limits  TROPONIN T, HIGH SENSITIVITY - Abnormal; Notable for the following components:   Troponin T High Sensitivity 25 (*)    All other components within normal limits  TROPONIN T, HIGH SENSITIVITY - Abnormal; Notable for the following components:   Troponin T High Sensitivity 23 (*)    All other components within normal limits    EKG: EKG Interpretation Date/Time:  Monday October 19 2024 19:34:29 EST Ventricular Rate:  89 PR Interval:  127 QRS Duration:  105 QT Interval:  355 QTC Calculation: 432 R Axis:   -27  Text Interpretation: Sinus rhythm LVH with secondary repolarization abnormality Inferior infarct, old Anterior infarct, old Confirmed by Melvenia Motto [phone]) on 10/19/2024 8:04:57 PM  Radiology: CT HEAD WO CONTRAST Result Date: 10/19/2024 CLINICAL DATA:  Syncope presyncope EXAM: CT HEAD WITHOUT CONTRAST TECHNIQUE: Contiguous axial images were obtained from the base of the skull through the vertex without intravenous contrast. RADIATION DOSE REDUCTION: This exam was performed according to the departmental dose-optimization program which includes automated exposure control, adjustment of the mA and/or kV according to patient size and/or use of iterative reconstruction technique. COMPARISON:  CT brain 02/07/2023 FINDINGS: Brain: No acute territorial infarction, hemorrhage or intracranial mass. Atrophy and chronic  small vessel ischemic changes of the white matter. Stable ventricle size. Vascular: No hyperdense vessels.  Carotid vascular calcification Skull: Normal. Negative for fracture or focal lesion. Sinuses/Orbits: Mucosal thickening in the sinuses Other: None IMPRESSION: 1. No CT evidence for acute intracranial abnormality. 2. Atrophy and chronic small vessel ischemic changes of the white matter. Electronically Signed   By: Luke Bun M.D.   On: 10/19/2024 21:33   DG Chest Port 1 View Result Date: 10/19/2024 CLINICAL DATA:  Syncopal episode. EXAM: PORTABLE CHEST 1 VIEW COMPARISON:  02/07/2023 FINDINGS: The cardiac silhouette, mediastinal and hilar contours are within normal limits given the AP projection,  portable technique and low lung volumes. Low lung volumes with vascular crowding and streaky bibasilar atelectasis. There is also eventration of both hemidiaphragms. No infiltrates or effusions or pneumothorax. IMPRESSION: Low lung volumes with vascular crowding and streaky bibasilar atelectasis. Electronically Signed   By: MYRTIS Stammer M.D.   On: 10/19/2024 20:01     Procedures   Medications Ordered in the ED - No data to display                                  Medical Decision Making Amount and/or Complexity of Data Reviewed Labs: ordered. Radiology: ordered.   This patient presents to the ED for concern of syncope, this involves an extensive number of treatment options, and is a complaint that carries with it a high risk of complications and morbidity.  The differential diagnosis includes vasovagal episode, arrhythmia, dehydration, anemia, seizure, TIA, metabolic derangements   Co morbidities / Chronic conditions that complicate the patient evaluation  breast cancer, arthritis, dementia, Crohn's disease, anxiety   Additional history obtained:  Additional history obtained from EMR External records from outside source obtained and reviewed including patient's daughter   Lab  Tests:  I Ordered, and personally interpreted labs.  The pertinent results include: Mild leukocytosis, creatinine slightly increased at baseline, lab work otherwise unremarkable   Imaging Studies ordered:  I ordered imaging studies including chest x-ray, CT head I independently visualized and interpreted imaging which showed no acute findings I agree with the radiologist interpretation   Cardiac Monitoring: / EKG:  The patient was maintained on a cardiac monitor.  I personally viewed and interpreted the cardiac monitored which showed an underlying rhythm of: Sinus rhythm   Problem List / ED Course / Critical interventions / Medication management  Patient presenting for syncopal episode.  This was witnessed by staff member at her facility.  It occurred while she was using the bathroom.  Estimated duration was 5 minutes.  On arrival in the ED, vital signs are normal.  Patient is well-appearing on exam.  She is disoriented which is reportedly her baseline.  She is not able to describe any symptoms.  She does not have any focal neurologic deficits.  Her breathing is unlabored.  She does not have any areas of tenderness.  She does endorse some right ankle pain with range of motion.  Will obtain x-ray.  Further workup initiated.  Lab work and imaging studies were unremarkable.  Patient remained asymptomatic while in the ED.  She remained in a sinus rhythm with occasional PACs.  Patient's daughter/medical decision maker remains at bedside and is very much in favor of her returning to her facility.  I feel that this is reasonable.  Patient was discharged in stable condition.  Social Determinants of Health:  Has dementia     Final diagnoses:  Syncope, unspecified syncope type    ED Discharge Orders     None          Melvenia Motto, MD 10/19/24 2324  "

## 2024-10-19 NOTE — ED Notes (Signed)
 Patient daughter asked about patients pants being missing. However, patient presented to ER from facility without pants, only wearing briefs and a top. RN explained this to daughter. Patient provided with paper pants at discharge.
